# Patient Record
Sex: Male | Born: 1990 | Race: White | Hispanic: No | Marital: Single | Smoking: Never smoker
Health system: Southern US, Community
[De-identification: ages and names within clinical notes are randomized; demographics above are authoritative.]

## PROBLEM LIST (undated history)

## (undated) HISTORY — PX: TONSILLECTOMY: SUR1361

---
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
## 2009-01-01 ENCOUNTER — Ambulatory Visit (HOSPITAL_COMMUNITY): Admission: AD | Admit: 2009-01-01 | Discharge: 2009-01-02 | Payer: Self-pay | Admitting: Otolaryngology

## 2009-01-13 ENCOUNTER — Encounter (INDEPENDENT_AMBULATORY_CARE_PROVIDER_SITE_OTHER): Payer: Self-pay | Admitting: Otolaryngology

## 2010-04-29 ENCOUNTER — Emergency Department (HOSPITAL_BASED_OUTPATIENT_CLINIC_OR_DEPARTMENT_OTHER): Admission: EM | Admit: 2010-04-29 | Discharge: 2010-04-29 | Payer: Self-pay | Admitting: Emergency Medicine

## 2010-04-29 ENCOUNTER — Ambulatory Visit: Payer: Self-pay | Admitting: Interventional Radiology

## 2010-12-29 LAB — DIFFERENTIAL
Eosinophils Absolute: 0.2 10*3/uL (ref 0.0–0.7)
Eosinophils Relative: 2 % (ref 0–5)
Lymphocytes Relative: 18 % (ref 12–46)
Lymphs Abs: 1.6 10*3/uL (ref 0.7–4.0)
Monocytes Absolute: 1 10*3/uL (ref 0.1–1.0)
Monocytes Relative: 11 % (ref 3–12)
Neutro Abs: 6.2 10*3/uL (ref 1.7–7.7)

## 2010-12-29 LAB — CBC
Hemoglobin: 13.2 g/dL (ref 13.0–17.0)
MCHC: 34.2 g/dL (ref 30.0–36.0)
MCV: 91 fL (ref 78.0–100.0)
Platelets: 196 10*3/uL (ref 150–400)
RBC: 4.24 MIL/uL (ref 4.22–5.81)
WBC: 8.9 10*3/uL (ref 4.0–10.5)

## 2010-12-29 LAB — COMPREHENSIVE METABOLIC PANEL
ALT: 15 U/L (ref 0–53)
Alkaline Phosphatase: 59 U/L (ref 39–117)
BUN: 16 mg/dL (ref 6–23)
Calcium: 9.3 mg/dL (ref 8.4–10.5)
Creatinine, Ser: 1.11 mg/dL (ref 0.4–1.5)
GFR calc Af Amer: >60 mL/min (ref >60)
GFR calc non Af Amer: >60 mL/min (ref >60)
Glucose, Bld: 94 mg/dL (ref 70–99)
Potassium: 3.9 mEq/L (ref 3.5–5.1)
Total Bilirubin: 0.6 mg/dL (ref 0.3–1.2)
Total Protein: 6.6 g/dL (ref 6.0–8.3)

## 2010-12-29 LAB — RAPID STREP SCREEN (MED CTR MEBANE ONLY): Streptococcus, Group A Screen (Direct): NEGATIVE

## 2012-12-31 ENCOUNTER — Ambulatory Visit (INDEPENDENT_AMBULATORY_CARE_PROVIDER_SITE_OTHER): Payer: BC Managed Care – PPO | Admitting: Family Medicine

## 2012-12-31 ENCOUNTER — Ambulatory Visit: Payer: BC Managed Care – PPO

## 2012-12-31 ENCOUNTER — Telehealth: Payer: Self-pay

## 2012-12-31 VITALS — BP 118/64 | HR 92 | Temp 98.8°F | Resp 16 | Ht 71.0 in | Wt 199.2 lb

## 2012-12-31 DIAGNOSIS — M25521 Pain in right elbow: Secondary | ICD-10-CM

## 2012-12-31 DIAGNOSIS — M25572 Pain in left ankle and joints of left foot: Secondary | ICD-10-CM

## 2012-12-31 DIAGNOSIS — S52131A Displaced fracture of neck of right radius, initial encounter for closed fracture: Secondary | ICD-10-CM

## 2012-12-31 DIAGNOSIS — M25579 Pain in unspecified ankle and joints of unspecified foot: Secondary | ICD-10-CM

## 2012-12-31 DIAGNOSIS — S52133A Displaced fracture of neck of unspecified radius, initial encounter for closed fracture: Secondary | ICD-10-CM

## 2012-12-31 DIAGNOSIS — M25529 Pain in unspecified elbow: Secondary | ICD-10-CM

## 2012-12-31 NOTE — Progress Notes (Signed)
Subjective: Patient is here having fallen when playing football this morning it dry. He stepped on someone's foot, twisted his left ankle, and landed on his right elbow. Both foot and elbow are painful. Actually the elbows is the most painful thing. The ankle itself is not painful but the lateral aspect of the foot is  Objective: Patient is very tender on the right low, right over a fairly large effusion. There is a great deal of pain if he attempts to supinate, preferring to hold his hand in a pronated position. He has some pain up his arm to the biceps and down to mid forearm. Pulses and neurovascular status is good.  His left foot is swollen, especially laterally. Is tender over the tarsal metatarsal area and the proximal fourth and fifth metatarsals. Neurovascular is intact. There is moderate amount of swelling.  Assessment: Right elbow pain and effusion Left foot pain prox 5th metatarsal/tarsal   Plan: X-ray foot and elbow  [facility] reading (PRIMARY) by  Dr. Alwyn Ren Fracture radial neck right No foot fracture noted.  Posterior splint right arm, long arm splint. Refer to orthopedics Cam Walker left foot. Crutch on left if helpful. Patient has a Personal assistant. Ibuprofen

## 2012-12-31 NOTE — Patient Instructions (Addendum)
Apply ice to foot and elbow for about 15-20 minutes  on and off as tolerated  Take ibuprofen 800 mg 3 times daily (maximum 20 mg in 24 hours) for pain  We will refer you to an orthopedist  Wear the Cam Walker on the left foot. Use crutch if helpful.

## 2012-12-31 NOTE — Telephone Encounter (Signed)
PT STATES THAT HE IS IN A LOT OF PAIN AND WOULD LIKE SOMETHING CALLED IN. [facility]ACYCatalina [facility]ger AND [facility]# [phone]

## 2013-01-01 ENCOUNTER — Telehealth: Payer: Self-pay | Admitting: Radiology

## 2013-01-01 NOTE — Telephone Encounter (Signed)
Called patient to advise he has appt today at 2:15 at [facility], but he may see Dr Jerl Santos instead. Patient is instructed to call me back and let me know where he wants to be seen.

## 2013-01-01 NOTE — Telephone Encounter (Signed)
I have called him to advise he has ortho appt. He is supposed to call me back to advise if he wants the appt or not.

## 2015-02-28 IMAGING — CR DG FOOT COMPLETE 3+V*L*
3 series · 3 of 3 positions shown · non-contrast
Comparison: None.

CLINICAL DATA: Fall

LEFT FOOT - COMPLETE 3+ VIEW

[AP]
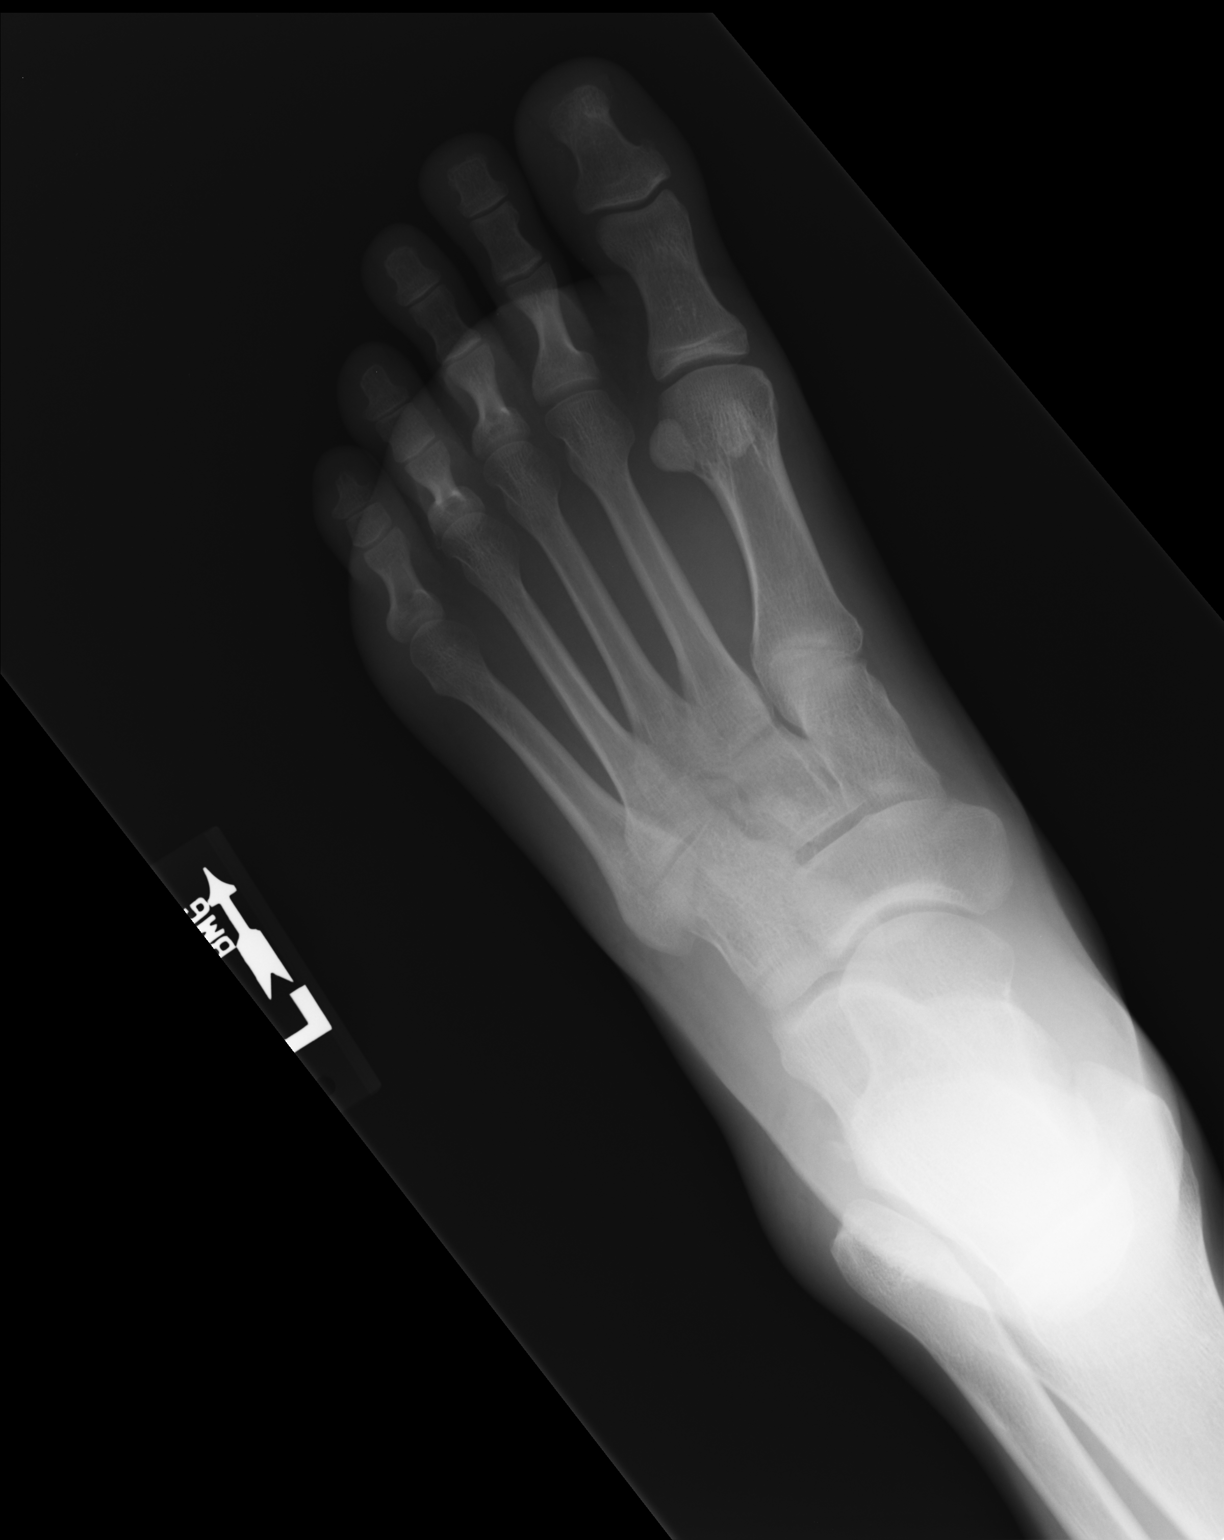

[ap obl int rot]
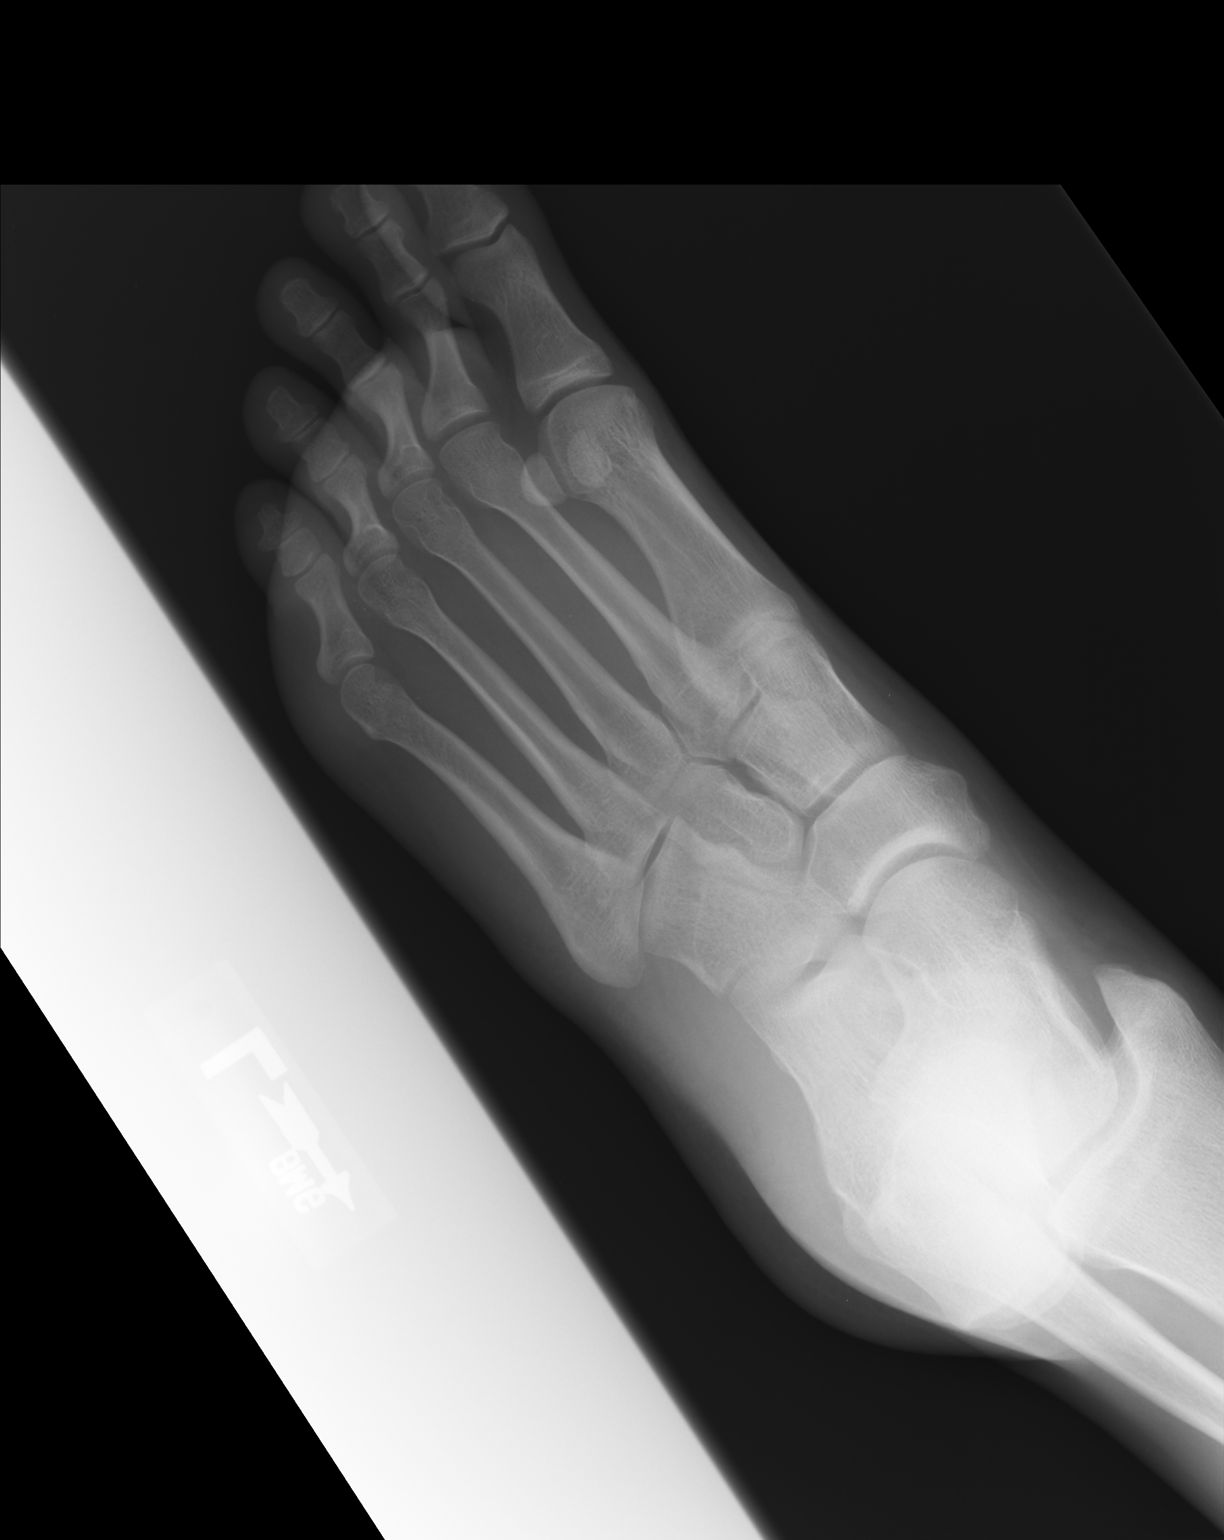

[lateral]
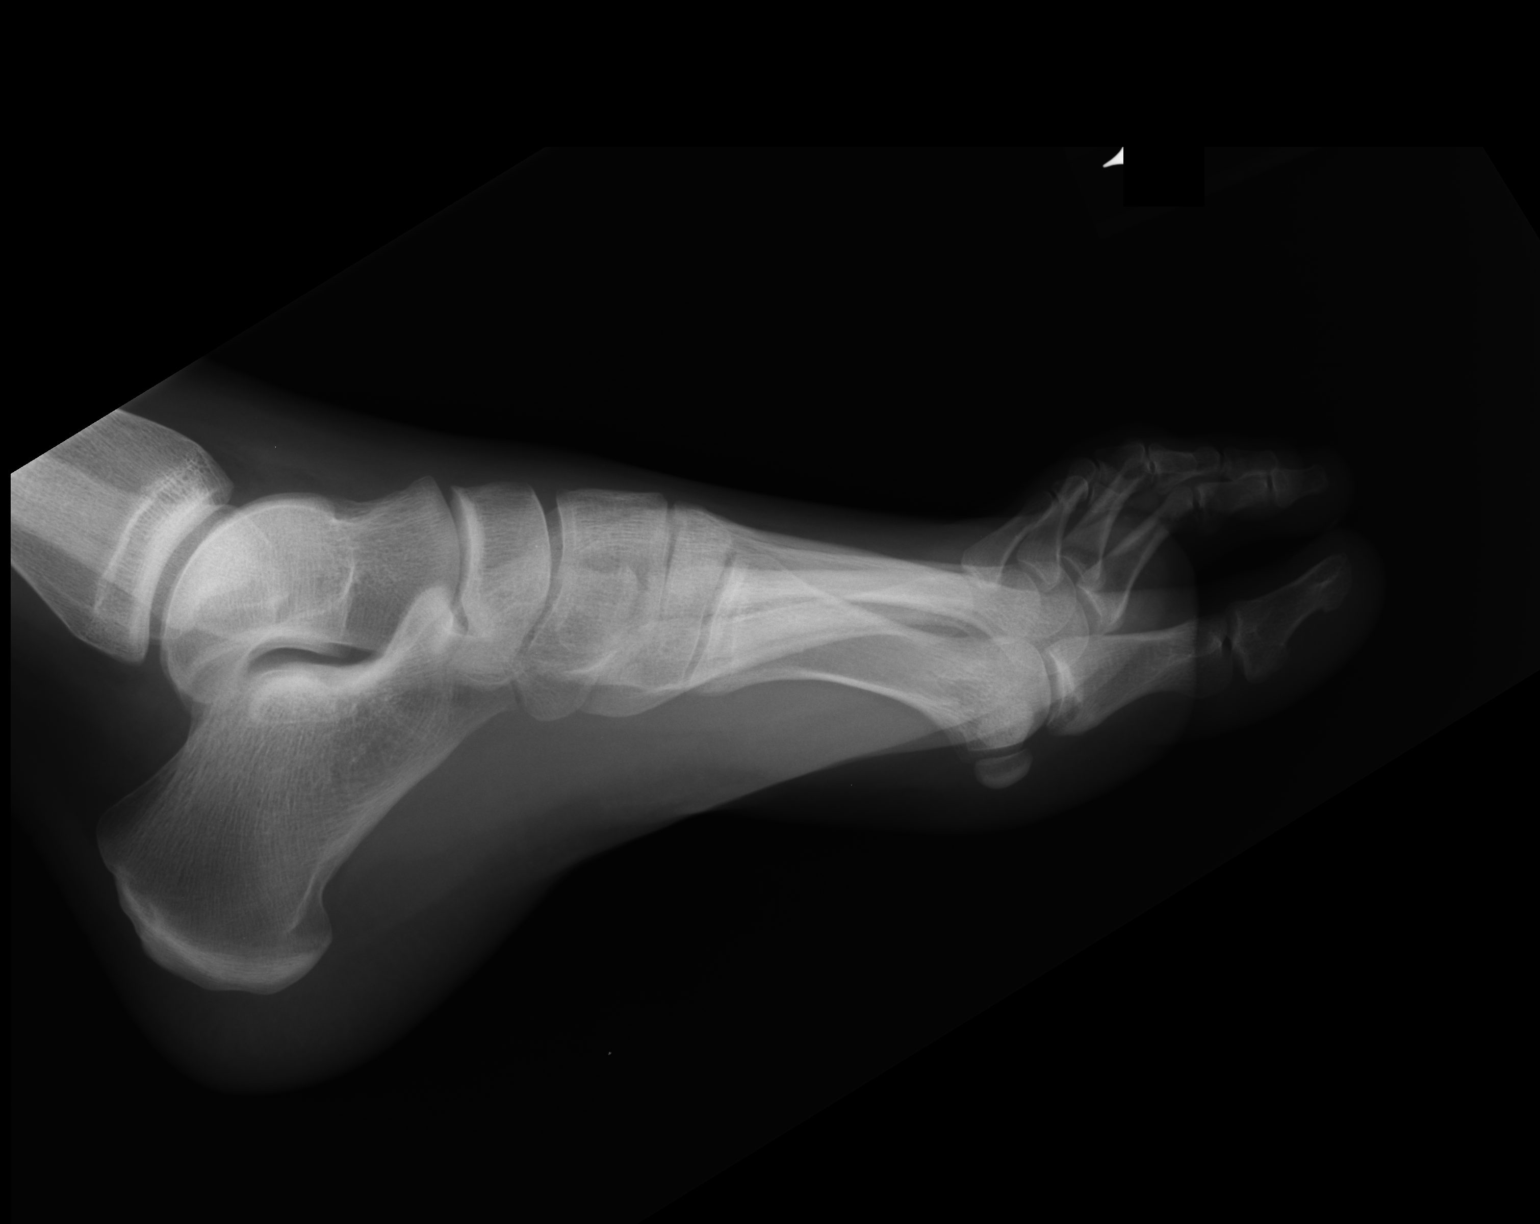

[3 of 3 positions shown; findings below may reference images not displayed]

FINDINGS: Negative for fracture.  Normal alignment and no
significant arthropathy is detected.
IMPRESSION: Negative

## 2015-02-28 IMAGING — CR DG ELBOW COMPLETE 3+V*R*
3 series · 3 of 3 positions shown · non-contrast
Comparison: None

CLINICAL DATA: Elbow pain

RIGHT ELBOW - COMPLETE 3+ VIEW

[AP]
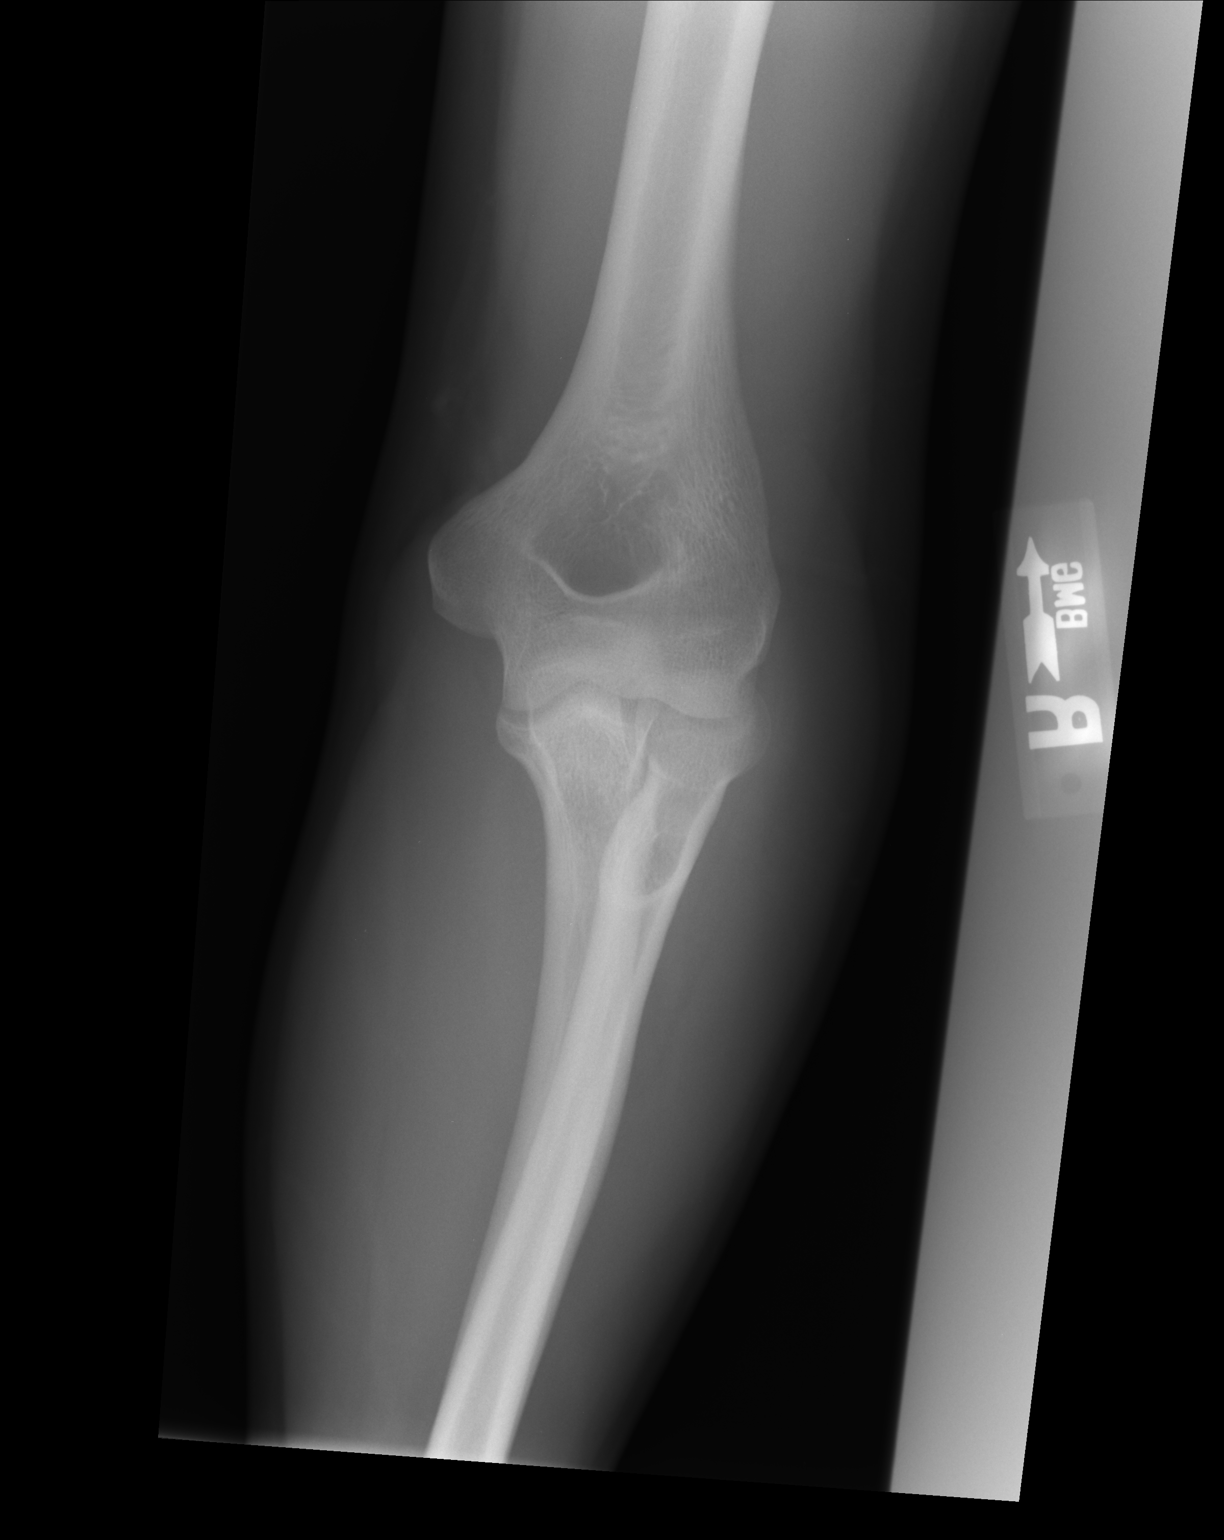

[ap obl ext rot]
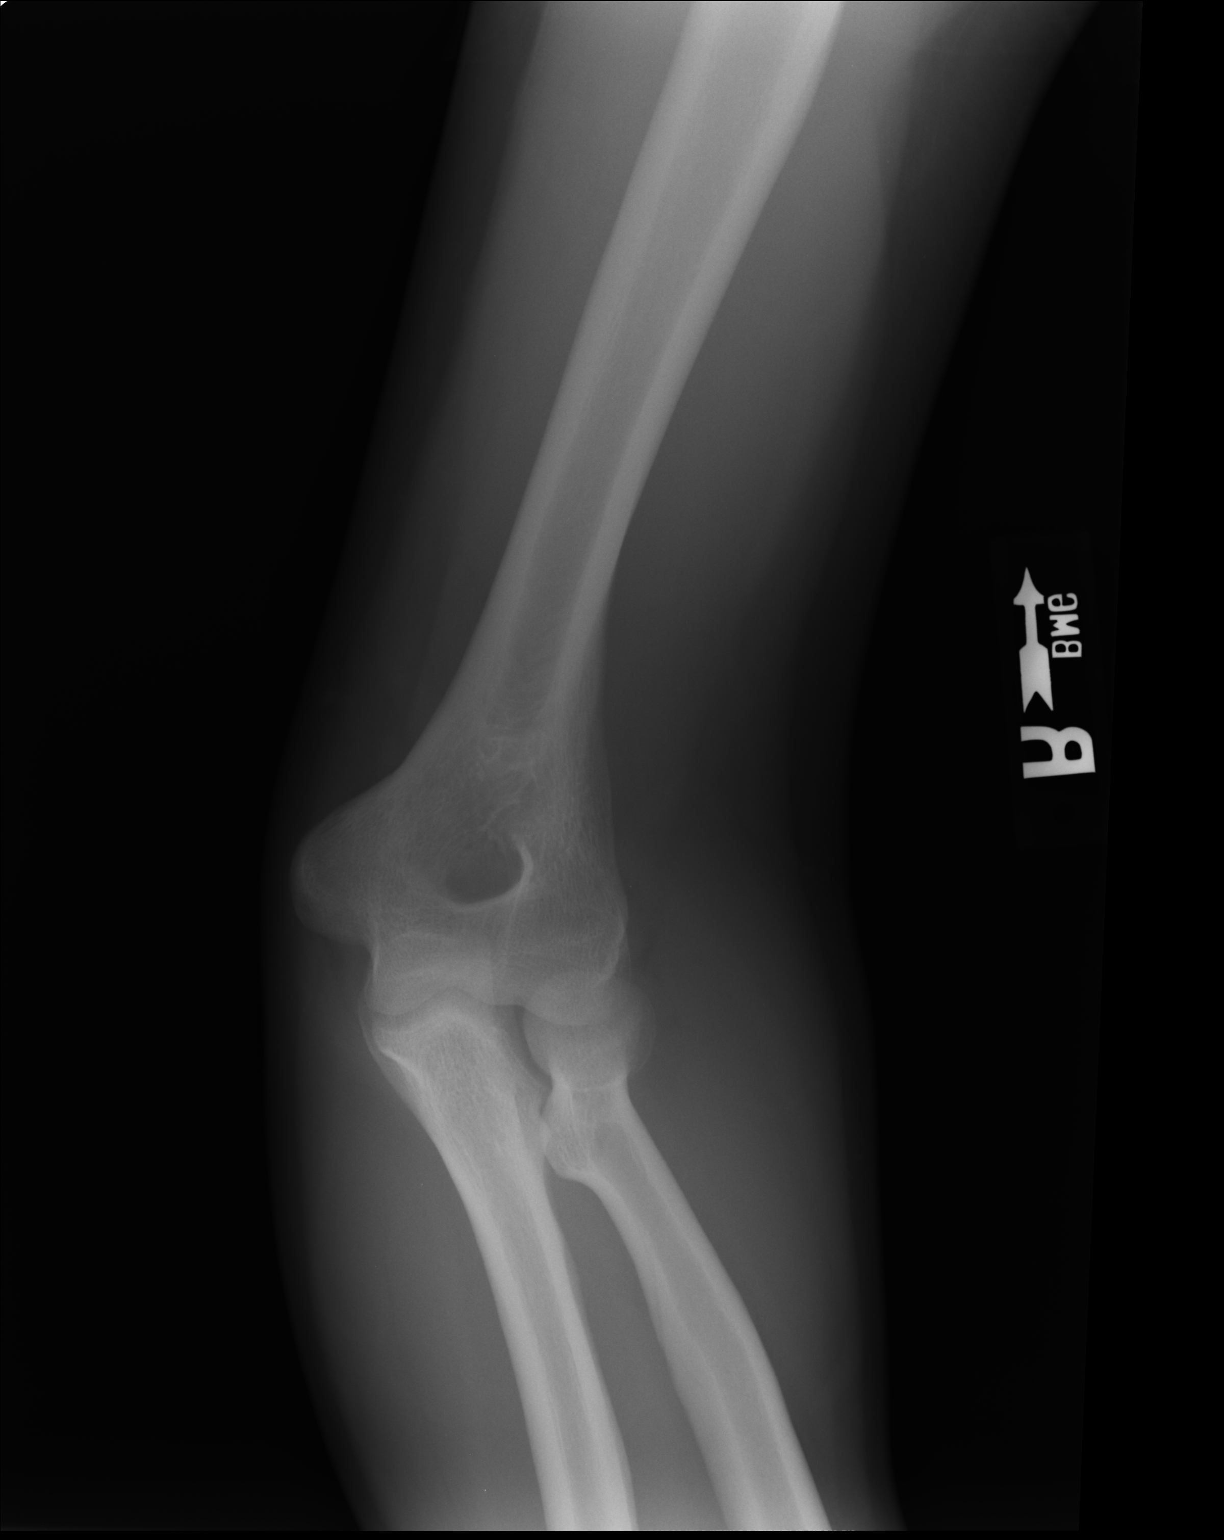

[lateral]
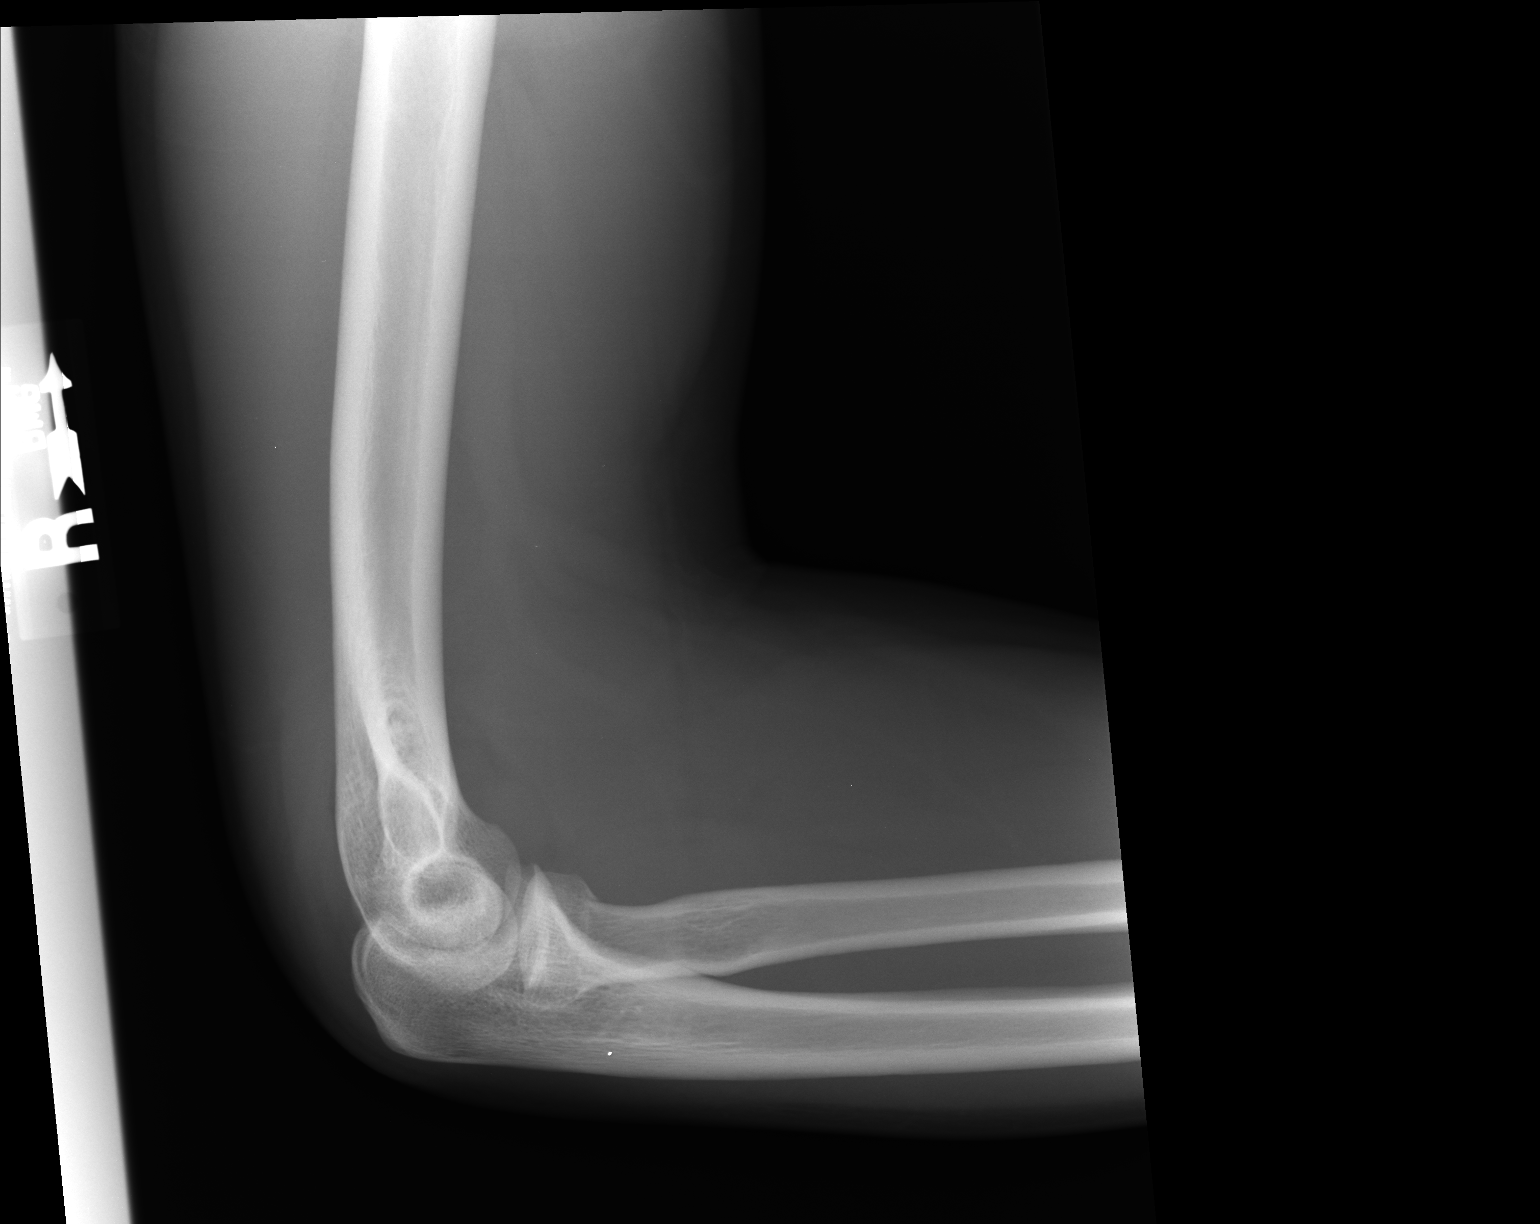

[3 of 3 positions shown; findings below may reference images not displayed]

FINDINGS: Normal alignment and no fracture.  No significant
degenerative change.
IMPRESSION: Negative

## 2015-09-08 ENCOUNTER — Ambulatory Visit (INDEPENDENT_AMBULATORY_CARE_PROVIDER_SITE_OTHER): Payer: BC Managed Care – PPO | Admitting: Physician Assistant

## 2015-09-08 ENCOUNTER — Encounter: Payer: Self-pay | Admitting: Physician Assistant

## 2015-09-08 VITALS — BP 120/84 | HR 80 | Temp 98.6°F | Resp 16 | Ht 71.5 in | Wt 220.4 lb

## 2015-09-08 DIAGNOSIS — H7291 Unspecified perforation of tympanic membrane, right ear: Secondary | ICD-10-CM

## 2015-09-08 DIAGNOSIS — Z23 Encounter for immunization: Secondary | ICD-10-CM | POA: Diagnosis not present

## 2015-09-08 DIAGNOSIS — H7419 Adhesive middle ear disease, unspecified ear: Secondary | ICD-10-CM

## 2015-09-08 MED ORDER — PSEUDOEPHEDRINE HCL 60 MG PO TABS
60.0000 mg | ORAL_TABLET | Freq: Four times a day (QID) | ORAL | Status: AC | PRN
Start: 2015-09-08 — End: ?

## 2015-09-08 NOTE — Progress Notes (Signed)
[facility] [address], [place] [id number] [phone]  Date:  09/08/2015   Name:  Derrick FifeKevin Pacheco   DOB:  12/09/1990   MRN:  [id number]  PCP:  No primary care provider on file.    History of Present Illness:  Derrick Pacheco is a 24 y.o. male patient who [facility] of ear [facility].  Patient reports this "discomfort" for the last 5 days.  Though he has had issues with ear discomfort for the last 5 years.  At this time, hearing feels a little muffled.  He has not tried any blow through nose with mouth shut for ear clearing, due to having past encounter that made him very dizzy.  He has tried syringes to clear ear wax, which only showed minimal cerumen.  He denies any dysequlibrium, no recent ur symptoms, or fever.  He has had no ear drainage.    He would also like a tdap at this time.  He is finishing school, and was advised to have this updated.  Last td 2003.  Fiance with family planning in future.   Non-smoker for 2 years  There are no active problems to display for this patient.   No past medical history on file.  Past Surgical History  Procedure Laterality Date  . Tonsillectomy      Social History  Substance Use Topics  . Smoking status: Never Smoker   . Smokeless tobacco: None  . Alcohol Use: No    No family history on file.  No Known Allergies  Medication list has been reviewed and updated.  No current outpatient prescriptions on file prior to visit.   No current facility-administered medications on file prior to visit.    ROS ROS otherwise unremarkable unless listed above.   Physical Examination: BP 120/84 mmHg  Pulse 80  Temp(Src) 98.6 F (37 C) (Oral)  Resp 16  Ht 5' 11.5" (1.816 m)  Wt 220 lb 6.4 oz (99.973 kg)  BMI 30.31 kg/m2  SpO2 97% Ideal Body Weight: Weight in (lb) to have BMI = 25: 181.4  Physical Exam  Constitutional: He is oriented to person, place, and time. He appears well-developed and  well-nourished. No distress.  HENT:  Head: Atraumatic.  Right Ear: External ear and ear canal normal. Tympanic membrane is not bulging. No middle ear effusion.  Left Ear: Tympanic membrane, external ear and ear canal normal.  Nose: No mucosal edema or rhinorrhea. Right sinus exhibits no maxillary sinus tenderness and no frontal sinus tenderness. Left sinus exhibits no maxillary sinus tenderness and no frontal sinus tenderness.  Mouth/Throat: No uvula swelling. No oropharyngeal exudate, posterior oropharyngeal edema or posterior oropharyngeal erythema.  Right tympanic membrane appears stuck onto the malleolus, with possibility that this is a perforation.  minimal injection along this adjacent structures. Ac>bc bilaterally Lateralization to right ear  Eyes: Conjunctivae, EOM and lids are normal. Pupils are equal, round, and reactive to light. Right eye exhibits normal extraocular motion. Left eye exhibits normal extraocular motion.  Neck: Trachea normal and full passive range of motion without pain. No edema and no erythema present.  Cardiovascular: Normal rate and regular rhythm.  Exam reveals no gallop and no friction rub.   No murmur heard. Pulmonary/Chest: Effort normal. No respiratory distress. He has no decreased breath sounds. He has no wheezes. He has no rhonchi.  Neurological: He is alert and oriented to person, place, and time.  Skin: Skin is warm and dry. He is not  diaphoretic.  Psychiatric: He has a normal mood and affect. His behavior is normal.     Assessment and Plan: Derrick Pacheco is a 25 y.o. male who is here today for cc of ear discomfort.  Difficult to tell if this is a perforation, retraction, or if the tm has adhesion to the malleolus.  I am giving sudafed at this time, to offer some clearance if possible ETD.  But at this time, ent consult is appreciated.  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

## 2015-09-08 NOTE — Patient Instructions (Signed)
Please await contact for the ENT appointment. Please attempt to use the sudafed.
